# Patient Record
Sex: Male | Born: 2000
Health system: Southern US, Community
[De-identification: ages and names within clinical notes are randomized; demographics above are authoritative.]

## PROBLEM LIST (undated history)

## (undated) DIAGNOSIS — S83512A Sprain of anterior cruciate ligament of left knee, initial encounter: Secondary | ICD-10-CM

## (undated) DIAGNOSIS — S83289A Other tear of lateral meniscus, current injury, unspecified knee, initial encounter: Secondary | ICD-10-CM

## (undated) HISTORY — PX: EMBOLIZATION: SHX1496

## (undated) HISTORY — PX: WISDOM TOOTH EXTRACTION: SHX21

## (undated) HISTORY — PX: TONSILLECTOMY AND ADENOIDECTOMY: SUR1326

---
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
## 2021-03-30 ENCOUNTER — Ambulatory Visit (INDEPENDENT_AMBULATORY_CARE_PROVIDER_SITE_OTHER): Payer: Self-pay

## 2021-03-30 ENCOUNTER — Ambulatory Visit
Admission: EM | Admit: 2021-03-30 | Discharge: 2021-03-30 | Disposition: A | Discharge status: Home / Self Care | Payer: Self-pay | Attending: Physician Assistant | Admitting: Physician Assistant

## 2021-03-30 ENCOUNTER — Other Ambulatory Visit: Payer: Self-pay

## 2021-03-30 DIAGNOSIS — S6991XA Unspecified injury of right wrist, hand and finger(s), initial encounter: Secondary | ICD-10-CM

## 2021-03-30 DIAGNOSIS — M79644 Pain in right finger(s): Secondary | ICD-10-CM

## 2021-03-30 MED ORDER — TETANUS-DIPHTH-ACELL PERTUSSIS 5-2.5-18.5 LF-MCG/0.5 IM SUSY
0.5000 mL | PREFILLED_SYRINGE | Freq: Once | INTRAMUSCULAR | Status: AC
  Administered 2021-03-30: 0.5 mL via INTRAMUSCULAR

## 2021-03-30 MED ORDER — DOXYCYCLINE HYCLATE 100 MG PO CAPS: 100.0000 mg | ORAL_CAPSULE | Freq: Two times a day (BID) | ORAL | 0 refills | Status: AC

## 2021-03-30 NOTE — Discharge Instructions (Signed)
Your x-ray was normal.  We are going to start antibiotic to cover for infection.  Take doxycycline twice daily.  Continue with keeping finger elevated and using heat or ice for improvement of symptoms.  If your symptoms or not improving please follow-up with hand surgeon as we discussed.

## 2021-03-30 NOTE — ED Provider Notes (Signed)
[facility]    CSN: [id number] Arrival date & time: 03/30/21  1224      History   Chief Complaint Chief Complaint  Patient presents with   nail gun injury    HPI Russell Evans is a 20 y.o. male.   Patient presents today with a weeklong history of right index finger pain following injury.  Reports that a nail gun shot a nail through his right finger and has had ongoing pain and swelling since that time.  He has not been evaluated since injury.  Reports that he remove the nail without difficulty.  Denies any recent antibiotic use.  He is unsure when his last tetanus was.  He is left-handed.  Denies any numbness, paresthesias.  He has been using ice and over-the-counter analgesics without improvement of symptoms.  Reports pain has gradually improved and is currently rated 4/5 on a 0-10 pain scale, localized to right index PIP, described as aching/stiffness, worse with flexion, no alleviating factors identified.   Past Medical History:  Diagnosis Date   Lateral meniscal tear 01/2014   left   Left ACL tear 01/2014    Patient Active Problem List   Diagnosis Date Noted   Tears of meniscus and ACL of left knee    Sprain of MCL (medial collateral ligament) of knee     Past Surgical History:  Procedure Laterality Date   EMBOLIZATION     nasal blood vessel   KNEE ARTHROSCOPY WITH ANTERIOR CRUCIATE LIGAMENT (ACL) REPAIR WITH HAMSTRING GRAFT Left 01/31/2014   Procedure: KNEE ARTHROSCOPY WITH ANTERIOR CRUCIATE LIGAMENT (ACL) REPAIR WITH HAMSTRING GRAFT;  Surgeon: Nilda Simmer, MD;  Location: [facility];  Service: Orthopedics;  Laterality: Left;  Femoral nerve block   TONSILLECTOMY AND ADENOIDECTOMY     WISDOM TOOTH EXTRACTION         Home Medications    Prior to Admission medications   Medication Sig Start Date End Date Taking? Authorizing Provider  doxycycline (VIBRAMYCIN) 100 MG capsule Take 1 capsule (100 mg total) by mouth 2 (two) times daily.  03/30/21  Yes Sophea Rackham, Noberto Retort, PA-C    Family History Family History  Problem Relation Age of Onset   Hypertension Mother    Diabetes Paternal Grandmother    Kidney disease Paternal Grandmother        renal failure/dialysis    Social History Social History   Tobacco Use   Smoking status: Never   Smokeless tobacco: Never  Substance Use Topics   Alcohol use: No   Drug use: No     Allergies   Patient has no known allergies.   Review of Systems Review of Systems  Constitutional:  Positive for activity change. Negative for appetite change, fatigue and fever.  Respiratory:  Negative for cough and shortness of breath.   Cardiovascular:  Negative for chest pain.  Gastrointestinal:  Negative for abdominal pain, diarrhea, nausea and vomiting.  Musculoskeletal:  Positive for arthralgias. Negative for myalgias.  Skin:  Positive for wound. Negative for color change.  Neurological:  Negative for dizziness, light-headedness and headaches.    Physical Exam Triage Vital Signs ED Triage Vitals  Enc Vitals Group     BP 03/30/21 1353 122/76     Pulse Rate 03/30/21 1353 (!) 57     Resp 03/30/21 1353 18     Temp 03/30/21 1353 97.7 F (36.5 C)     Temp Source 03/30/21 1353 Oral     SpO2 03/30/21 1353 98 %  Weight --      Height --      Head Circumference --      Peak Flow --      Pain Score 03/30/21 1354 0     Pain Loc --      Pain Edu? --      Excl. in GC? --    No data found.  Updated Vital Signs BP 122/76 (BP Location: Right Arm)   Pulse (!) 57   Temp 97.7 F (36.5 C) (Oral)   Resp 18   SpO2 98%   Visual Acuity Right Eye Distance:   Left Eye Distance:   Bilateral Distance:    Right Eye Near:   Left Eye Near:    Bilateral Near:     Physical Exam Vitals reviewed.  Constitutional:      General: He is awake.     Appearance: Normal appearance. He is well-developed. He is not ill-appearing.     Comments: Very pleasant male appears stated age in no acute  distress sitting comfortably in exam room  HENT:     Head: Normocephalic and atraumatic.  Cardiovascular:     Rate and Rhythm: Normal rate and regular rhythm.     Pulses:          Radial pulses are 2+ on the right side and 2+ on the left side.     Heart sounds: Normal heart sounds, S1 normal and S2 normal. No murmur heard.    Comments: Capillary refill within 2 seconds right fingers Pulmonary:     Effort: Pulmonary effort is normal.     Breath sounds: Normal breath sounds. No stridor. No wheezing, rhonchi or rales.     Comments: Clear to auscultation bilaterally Musculoskeletal:     Right hand: Tenderness present. No bony tenderness. Decreased range of motion. Normal strength. There is no disruption of two-point discrimination. Normal capillary refill.     Comments: Right hand: Tenderness to palpation of right index PIP joint.  Wounds noted without active bleeding or drainage.  Hand neurovascularly intact.  Normal flexion and extension.  Normal pincer and grip strength.  Neurological:     Mental Status: He is alert.  Psychiatric:        Behavior: Behavior is cooperative.        UC Treatments / Results  Labs (all labs ordered are listed, but only abnormal results are displayed) Labs Reviewed - No data to display  EKG   Radiology DG Finger Index Right  Result Date: 03/30/2021 CLINICAL DATA:  Nail gun injury of the right index finger 1 week ago with continued dull pain. EXAM: RIGHT INDEX FINGER THREE VIEWS COMPARISON:  None. FINDINGS: No fracture or bony malalignment. No retained foreign body or gas in the soft tissues. Possible volar soft tissue swelling proximally in the finger, correlate with visual inspection. IMPRESSION: 1. No fracture, bony malalignment, retained foreign body, or gas identified in the soft tissues. Electronically Signed   By: Gaylyn Rong M.D.   On: 03/30/2021 14:28    Procedures Procedures (including critical care time)  Medications Ordered in  UC Medications  Tdap (BOOSTRIX) injection 0.5 mL (0.5 mLs Intramuscular Given 03/30/21 1415)    Initial Impression / Assessment and Plan / UC Course  I have reviewed the triage vital signs and the nursing notes.  Pertinent labs & imaging results that were available during my care of the patient were reviewed by me and considered in my medical decision making (see chart for details).  X-ray obtained to rule out retained foreign body or osteomyelitis showed no acute abnormalities.  Will cover for infection given increased swelling and pain with doxycycline.  Patient was encouraged to alternate Tylenol and ibuprofen for pain relief.  He was encouraged to avoid strenuous activity including repetitive motions involving his hands.  He was provided a work excuse note with several days.  Discussed that if symptoms or not improving he should follow-up with hand surgeon was given contact information for local provider.  Discussed at length alarm symptoms that warrant emergent evaluation including numbness, decreased range of motion, swelling, fever, nausea, vomiting.  Strict return precautions given to which she expressed understanding.  Final Clinical Impressions(s) / UC Diagnoses   Final diagnoses:  Injury of finger of right hand, initial encounter  Finger pain, right     Discharge Instructions      Your x-ray was normal.  We are going to start antibiotic to cover for infection.  Take doxycycline twice daily.  Continue with keeping finger elevated and using heat or ice for improvement of symptoms.  If your symptoms or not improving please follow-up with hand surgeon as we discussed.     ED Prescriptions     Medication Sig Dispense Auth. Provider   doxycycline (VIBRAMYCIN) 100 MG capsule Take 1 capsule (100 mg total) by mouth 2 (two) times daily. 20 capsule Ellie Spickler, Noberto Retort, PA-C      PDMP not reviewed this encounter.   Jeani Hawking, PA-C 03/30/21 1451

## 2021-03-30 NOTE — ED Triage Notes (Signed)
Pt c/o nail gun injury through right index finger that occurred just over a week ago. States a white formation is starting under the skin. States sensation intact. Mobility intact. Dull pain. The nail was removed by pt using construction equipment.
